# Patient Record
Sex: Female | Born: 1950 | Race: White | Hispanic: No | Smoking: Never smoker
Health system: Southern US, Community
[De-identification: ages and names within clinical notes are randomized; demographics above are authoritative.]

## PROBLEM LIST (undated history)

## (undated) DIAGNOSIS — F419 Anxiety disorder, unspecified: Secondary | ICD-10-CM

## (undated) DIAGNOSIS — M549 Dorsalgia, unspecified: Secondary | ICD-10-CM

## (undated) HISTORY — PX: CHOLECYSTECTOMY: SHX55

## (undated) HISTORY — PX: HERNIA REPAIR: SHX51

---

## 2011-09-11 ENCOUNTER — Emergency Department (HOSPITAL_BASED_OUTPATIENT_CLINIC_OR_DEPARTMENT_OTHER)
Admission: EM | Admit: 2011-09-11 | Discharge: 2011-09-12 | Disposition: A | Discharge status: Home / Self Care | Attending: Emergency Medicine | Admitting: Emergency Medicine

## 2011-09-11 ENCOUNTER — Emergency Department (HOSPITAL_BASED_OUTPATIENT_CLINIC_OR_DEPARTMENT_OTHER)

## 2011-09-11 ENCOUNTER — Encounter (HOSPITAL_BASED_OUTPATIENT_CLINIC_OR_DEPARTMENT_OTHER): Payer: Self-pay

## 2011-09-11 DIAGNOSIS — Z23 Encounter for immunization: Secondary | ICD-10-CM | POA: Insufficient documentation

## 2011-09-11 DIAGNOSIS — R071 Chest pain on breathing: Secondary | ICD-10-CM | POA: Insufficient documentation

## 2011-09-11 DIAGNOSIS — S5010XA Contusion of unspecified forearm, initial encounter: Secondary | ICD-10-CM

## 2011-09-11 DIAGNOSIS — W19XXXA Unspecified fall, initial encounter: Secondary | ICD-10-CM | POA: Insufficient documentation

## 2011-09-11 DIAGNOSIS — R0789 Other chest pain: Secondary | ICD-10-CM

## 2011-09-11 DIAGNOSIS — Y92009 Unspecified place in unspecified non-institutional (private) residence as the place of occurrence of the external cause: Secondary | ICD-10-CM | POA: Insufficient documentation

## 2011-09-11 MED ORDER — KETOROLAC TROMETHAMINE 30 MG/ML IJ SOLN
30.0000 mg | Freq: Once | INTRAMUSCULAR | Status: AC
  Administered 2011-09-11: 30 mg via INTRAVENOUS
  Filled 2011-09-11: qty 1

## 2011-09-11 MED ORDER — IOHEXOL 300 MG/ML  SOLN: 100.0000 mL | Freq: Once | INTRAMUSCULAR | Status: AC | PRN

## 2011-09-11 MED ORDER — TETANUS-DIPHTH-ACELL PERTUSSIS 5-2.5-18.5 LF-MCG/0.5 IM SUSP
0.5000 mL | Freq: Once | INTRAMUSCULAR | Status: AC
  Administered 2011-09-11: 0.5 mL via INTRAMUSCULAR
  Filled 2011-09-11: qty 0.5

## 2011-09-11 NOTE — ED Notes (Signed)
I cleaned abrasion on patient's left elbow with saline and hydrogen peroxide. I then applied bacitracin and wrapped kerlix over 2x2's for padding and comfort.

## 2011-09-11 NOTE — ED Provider Notes (Signed)
History   This chart was scribed for Martha Quarry, MD scribed by Martha King. The patient was seen in room MH09/MH09 seen at 21:48   CSN: [id number]  Arrival date & time 09/11/11  2114   First MD Initiated Contact with Patient 09/11/11 2148      Chief Complaint  Patient presents with  . Fall    (Consider location/radiation/quality/duration/timing/severity/associated sxs/prior treatment) HPI Martha King is a 61 y.o. female who presents to the Emergency Department for evaluation after a fall that occurred 7 hours ago today outside her condo. She says she is having sharp left rib pain with associated left hand and arm pain as a result of the fall. Pt states after the fall she remained on the ground briefly, but does explain she was ambulatory afterwards. She explains she tripped when her flip flop folded over causing her to fall and land on her left side. She denies head injury, or LOC.  Has hx of degenerative disk in back recently and attributes today's back pain to most recent back issues. She states she does not take any daily medications or have any other health problems.  PCP: Dr. Ninetta Lights in [place]   History reviewed. No pertinent past medical history.  History reviewed. No pertinent past surgical history.  No family history on file.  History  Substance Use Topics  . Smoking status: Never Smoker   . Smokeless tobacco: Not on file  . Alcohol Use: No   Review of Systems  All other systems reviewed and are negative.   10 Systems reviewed and are negative for acute change except as noted in the HPI. Allergies  Review of patient's allergies indicates not on file.  Home Medications  No current outpatient prescriptions on file.  BP 145/71  Pulse 98  Temp 98.3 F (36.8 C) (Oral)  Resp 18  SpO2 99%  Physical Exam  Nursing note and vitals reviewed. Constitutional: She is oriented to person, place, and time. She appears well-developed and well-nourished. No  distress.  HENT:  Head: Normocephalic and atraumatic.  Eyes: EOM are normal. Pupils are equal, round, and reactive to light.  Neck: Neck supple. No tracheal deviation present.  Cardiovascular: Normal rate.   Pulmonary/Chest: Effort normal. No respiratory distress.  Abdominal: Soft. She exhibits no distension.  Musculoskeletal: Normal range of motion. She exhibits tenderness. She exhibits no edema.       Diffusely tender to the ulnar aspect of the left forearm  Neurological: She is alert and oriented to person, place, and time. No sensory deficit.  Skin: Skin is warm and dry.       Has contusions to left forearm.  Psychiatric: She has a normal mood and affect. Her behavior is normal.    ED Course  Procedures (including critical care time) DIAGNOSTIC STUDIES: Oxygen Saturation is 99% on room air, normal by my interpretation.    COORDINATION OF CARE:  Labs Reviewed - No data to display No results found.   No diagnosis found.    MDM  CT reviewed by me and with patient.  Patient with decreased pain in elbow without point tenderness and full arom although contusion.  Patient states she is unable to take narcotics but will take tylenol and ibuprofen.  Wounds to be cleansed and tdap given.     Martha Quarry, MD 09/11/11 2322

## 2011-09-11 NOTE — ED Notes (Signed)
Patient fell over toe of flip flop falling onto concrete. Complains of left arm pain, wrist and left rib pain, no loc. Ambulatory. Swelling noted to arm

## 2012-12-19 IMAGING — CT CT ABD-PELV W/ CM
4 of 5 series · 12 of 46 positions shown, 17 images · IV contrast (APPLIED)
Comparison: None.

CT CHEST

CLINICAL DATA: Trauma protocol.  The patient fell on the left side
and has left rib pain.

CT CHEST, ABDOMEN AND PELVIS WITH CONTRAST
TECHNIQUE: Multidetector CT imaging of the chest, abdomen and
pelvis was performed following the standard protocol during bolus
administration of intravenous contrast.
Contrast:  100 ml Omnipaque 300

[Series 2: chest/abd/pel 5.0 b31f · axial · 0.98mm/px · z∈[+834,+1184]mm · 6 of 126 slices shown]
[im 14/126  soft-tissue]
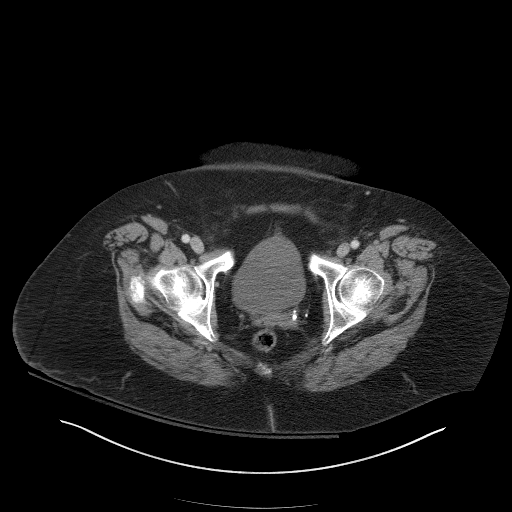
[im 28/126  soft-tissue]
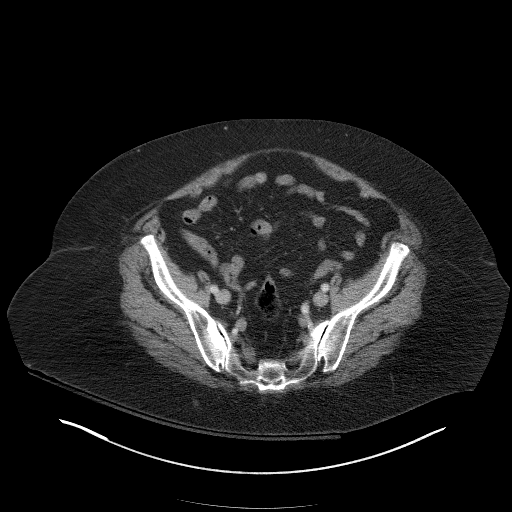
[im 42/126  soft-tissue]
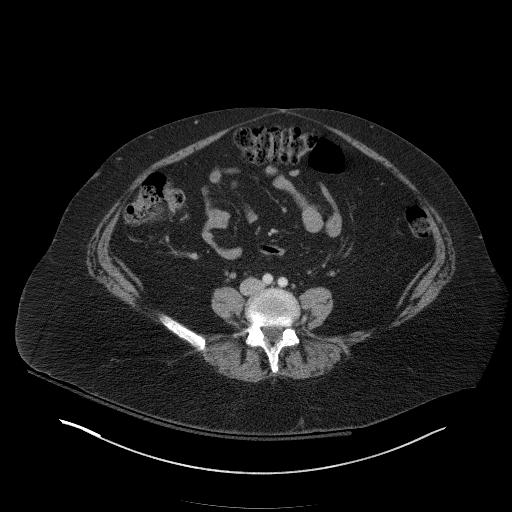
[im 56/126  soft-tissue]
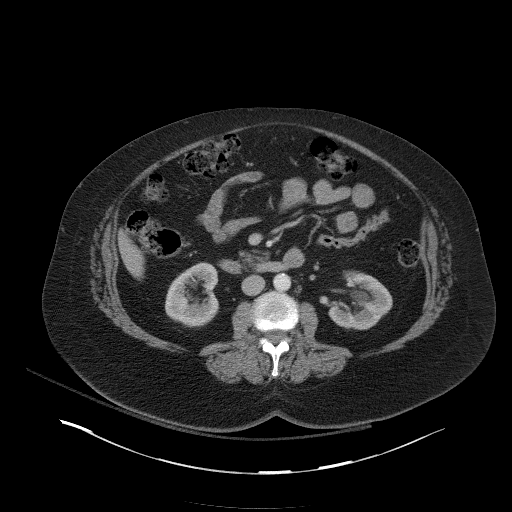
[im 70/126  soft-tissue]
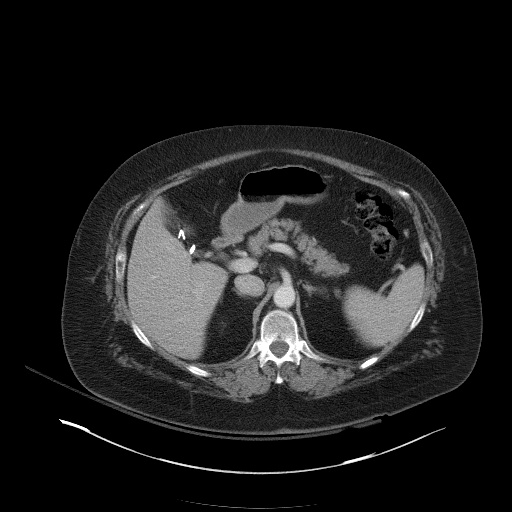
[im 84/126  soft-tissue]
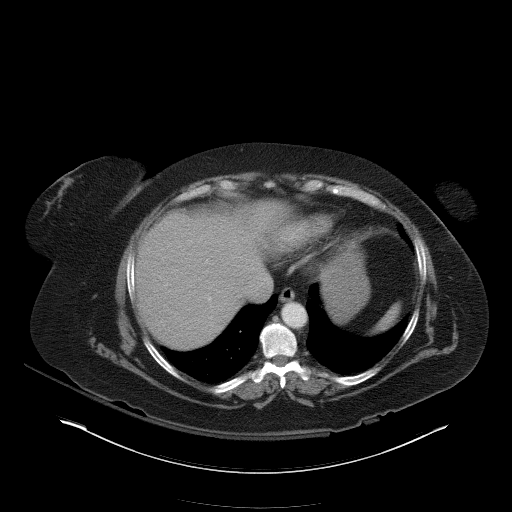

[Series 5: chest/abd/pel 3.0 coronal · coronal · 1.19mm/px · 3 of 102 slices shown, 4 images]
[im 34/102  soft-tissue]
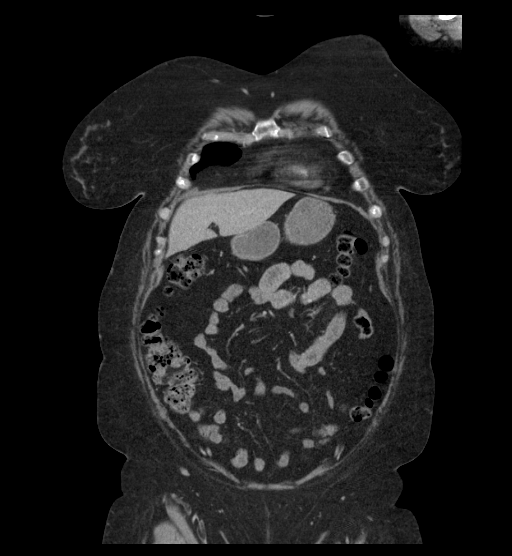
[im 45/102  soft-tissue]
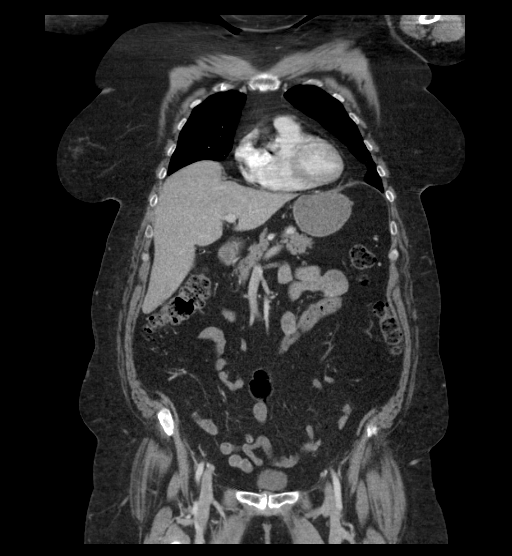
[im 45/102  bone]
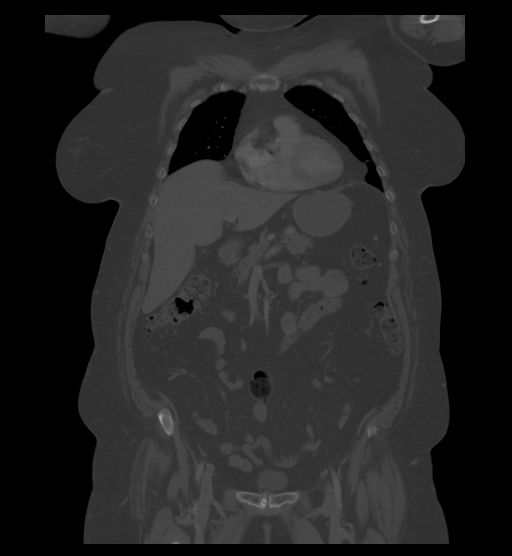
[im 57/102  soft-tissue]
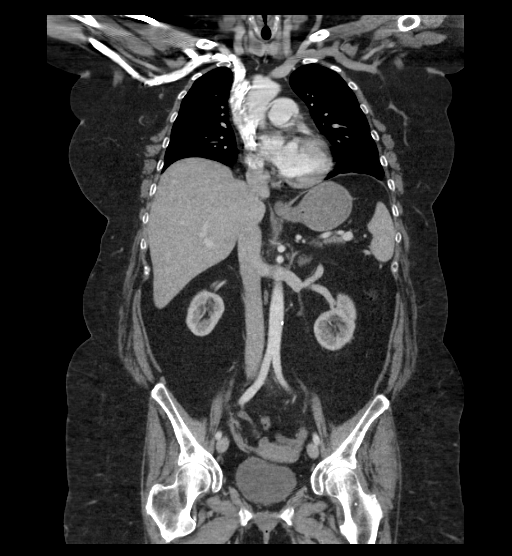

[Series 6: chest/abd/pel 3.0 sagittal · sagittal · 0.80mm/px · 1 of 138 slices shown, 2 images]
[im 46/138  soft-tissue]
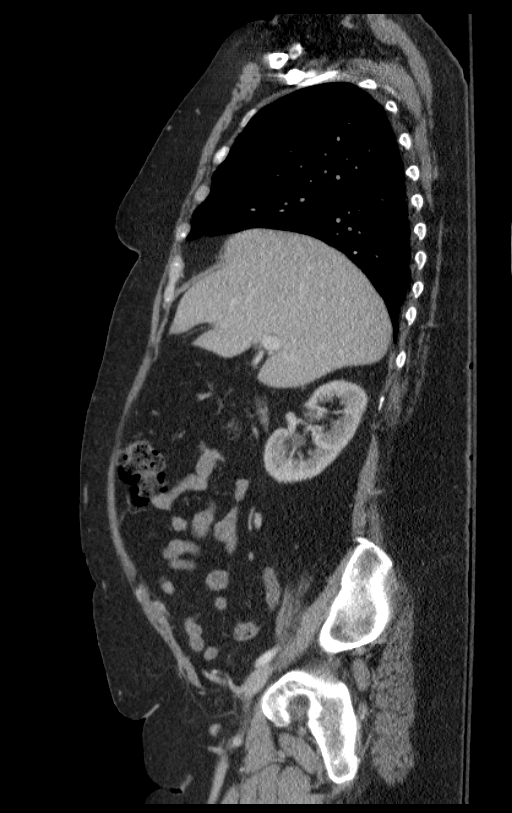
[im 46/138  bone]
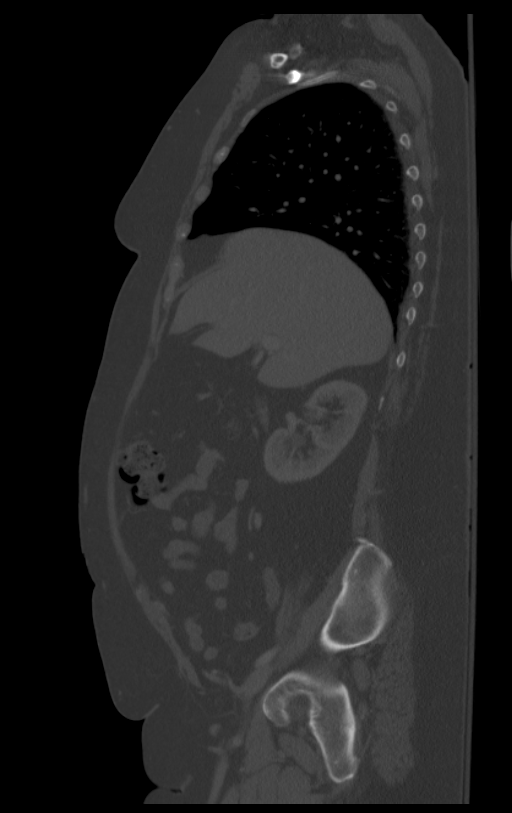

[Series 7: renal delay 5.0 b30f · axial · delayed · 0.97mm/px · z∈[+1032,+1077]mm · 2 of 27 slices shown, 5 images]
[im 9/27  soft-tissue]
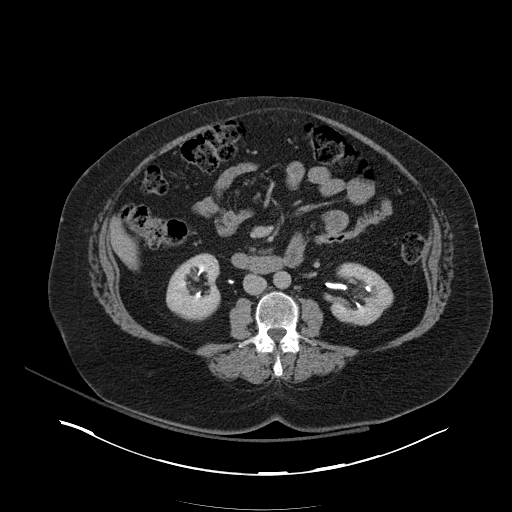
[im 9/27  lung]
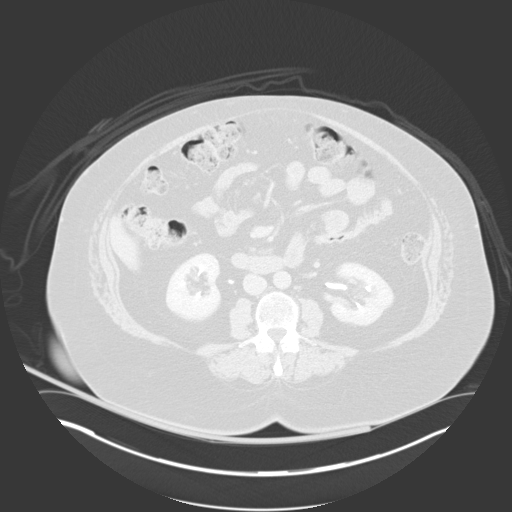
[im 9/27  bone]
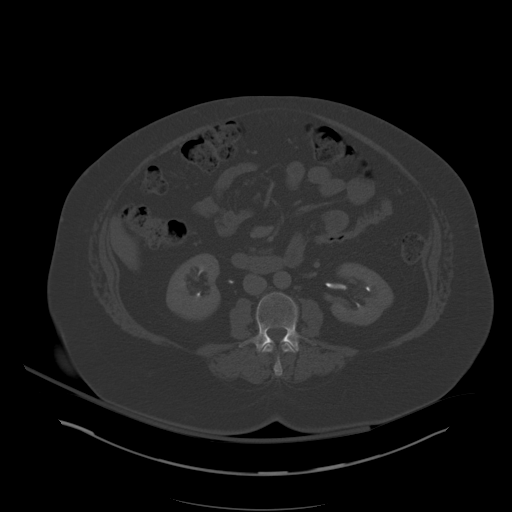
[im 18/27  soft-tissue]
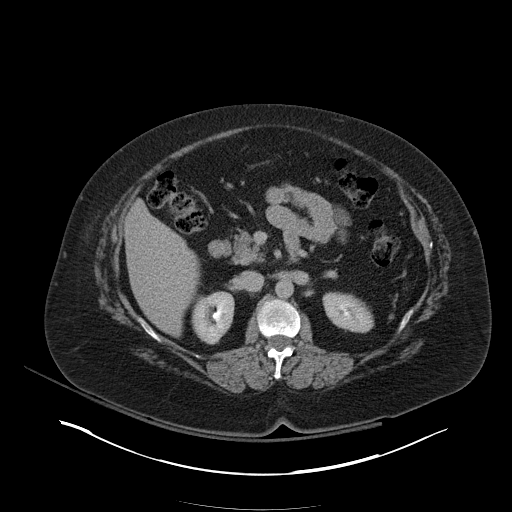
[im 18/27  lung]
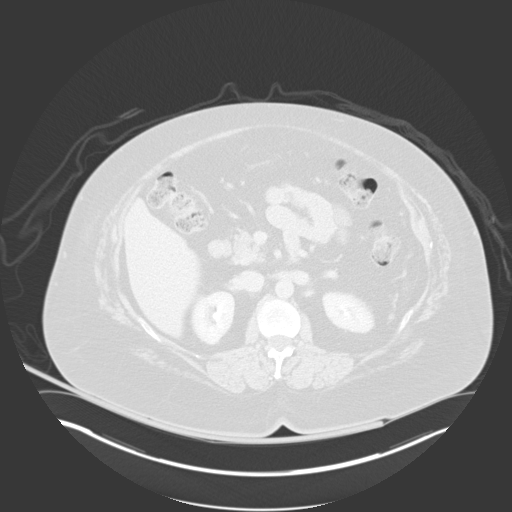

[12 of 46 positions shown; findings below may reference images not displayed]

FINDINGS: 11 mm hypodense nodule in the right thyroid gland.
Normal heart size.  Normal caliber thoracic aorta without evidence
of aneurysm or dissection.  No significant lymphadenopathy in the
chest.  No abnormal mediastinal fluid collections.  The esophagus
is decompressed.  No pleural effusion.  Dependent atelectasis in
the lung bases.  No focal airspace consolidation.  No pneumothorax.
Airways appear patent.

Normal alignment of the thoracic vertebra without compression
deformity.  Mild degenerative changes.  The sternum appears intact.
No displaced rib fractures identified.
IMPRESSION: No acute post-traumatic changes demonstrated in the chest.  Right
thyroid nodule.

CT ABDOMEN AND PELVIS
FINDINGS: Surgical absence of the gallbladder.  The liver, spleen,
pancreas, adrenal glands, abdominal aorta, and retroperitoneal
lymph nodes are unremarkable.  Small parapelvic cysts in the
kidneys.  No solid mass or hydronephrosis.  Homogeneous parenchymal
appearance.  The stomach, small bowel, and colon are not abnormally
distended.  No abnormal retroperitoneal fluid collections.  No free
fluid or free air in the abdomen.  Prominent visceral adipose
tissues.

Pelvis:  The uterus and adnexal structures are not enlarged.
Bladder wall is not thickened.  No free or loculated pelvic fluid
collections.  The appendix is normal.  No diverticulitis.  No
significant pelvic lymphadenopathy.

Normal alignment of the lumbar vertebrae without compression
deformity.  Mild degenerative disc disease at the lumbosacral
interspace.  The visualized pelvis, sacrum, and hips appear intact.
IMPRESSION: No acute process demonstrated in the abdomen or pelvis.  No
evidence of solid organ injury or bowel perforation.

## 2015-05-14 ENCOUNTER — Emergency Department (INDEPENDENT_AMBULATORY_CARE_PROVIDER_SITE_OTHER)
Admission: EM | Admit: 2015-05-14 | Discharge: 2015-05-14 | Disposition: A | Discharge status: Home / Self Care | Payer: Medicare Other | Source: Home / Self Care | Attending: Family Medicine | Admitting: Family Medicine

## 2015-05-14 ENCOUNTER — Encounter: Payer: Self-pay | Admitting: Emergency Medicine

## 2015-05-14 DIAGNOSIS — R0789 Other chest pain: Secondary | ICD-10-CM

## 2015-05-14 DIAGNOSIS — R51 Headache: Secondary | ICD-10-CM | POA: Diagnosis not present

## 2015-05-14 DIAGNOSIS — R519 Headache, unspecified: Secondary | ICD-10-CM

## 2015-05-14 NOTE — ED Notes (Signed)
Worst headache I've ever had" started today about 11:00am 10/10, Chest pains, tightness started Monday night 3-4

## 2015-05-14 NOTE — ED Provider Notes (Addendum)
CSN: [id number]     Arrival date & time 05/14/15  1527 History   First MD Initiated Contact with Patient 05/14/15 1546     Chief Complaint  Patient presents with  . Headache      HPI Comments: Patient complains of onset of substernal chest pain two days ago that lasted one hour and subsided.  At 10:30am today she developed a severe sharp right side headache.  She has no history of migraine headaches and states that this is the worst headache she has ever had.  She has had nausea without vomiting.  No localizing neurologic symptoms.  Patient is a 65 y.o. female presenting with headaches. The history is provided by the patient and a relative.  Headache Pain location:  R temporal and R parietal Quality:  Sharp Radiates to:  Does not radiate Severity currently:  10/10 Severity at highest:  10/10 Onset quality:  Sudden Timing:  Constant Progression:  Unchanged Chronicity:  New Similar to prior headaches: no   Context: bright light   Relieved by:  None tried Worsened by:  Nothing Ineffective treatments:  None tried Associated symptoms: blurred vision, fatigue, nausea, photophobia and visual change   Associated symptoms: no abdominal pain, no congestion, no dizziness, no eye pain, no facial pain, no fever, no focal weakness, no hearing loss, no loss of balance, no myalgias, no near-syncope, no neck pain, no neck stiffness, no numbness, no paresthesias, no seizures, no sinus pressure, no sore throat, no swollen glands, no syncope, no tingling, no URI, no vomiting and no weakness     History reviewed. No pertinent past medical history. History reviewed. No pertinent past surgical history. Family History  Problem Relation Age of Onset  . Anuerysm Mother   . Hypertension Father    Social History  Substance Use Topics  . Smoking status: Never Smoker   . Smokeless tobacco: None  . Alcohol Use: No   OB History    No data available     Review of Systems  Constitutional: Positive for  fatigue. Negative for fever.  HENT: Negative for congestion, hearing loss, sinus pressure and sore throat.   Eyes: Positive for blurred vision and photophobia. Negative for pain.  Cardiovascular: Negative for syncope and near-syncope.  Gastrointestinal: Positive for nausea. Negative for vomiting and abdominal pain.  Musculoskeletal: Negative for myalgias, neck pain and neck stiffness.  Neurological: Positive for headaches. Negative for dizziness, focal weakness, seizures, weakness, numbness, paresthesias and loss of balance.  All other systems reviewed and are negative.   Allergies  Review of patient's allergies indicates no known allergies.  Home Medications   Prior to Admission medications   Medication Sig Start Date End Date Taking? Authorizing Provider  escitalopram (LEXAPRO) 10 MG tablet Take 10 mg by mouth daily.   Yes Historical Provider, MD  acetaminophen (TYLENOL) 500 MG tablet Take 1,000 mg by mouth every 6 (six) hours as needed. For headache and pain.    Historical Provider, MD  predniSONE (DELTASONE) 10 MG tablet Take 10 mg by mouth daily.    Historical Provider, MD   Meds Ordered and Administered this Visit  Medications - No data to display  BP 136/84 mmHg  Pulse 76  Temp(Src) 97.8 F (36.6 C) (Oral)  Ht 5\' 3"  (1.6 m)  Wt 217 lb (98.431 kg)  BMI 38.45 kg/m2  SpO2 100% No data found.   Physical Exam Nursing notes and Vital Signs reviewed. Appearance:  Patient appears stated age, and in no acute distress.  She appears uncomfortable supine in darkened room, but is alert and oriented.  Patient is obese (BMI 38.5) Eyes:  Pupils are equal, round, and reactive to light and accomodation.  Extraocular movement is intact.  Conjunctivae are not inflamed.  Photophobia present  Nose:  Normal Mouth/Pharynx:   Tongue midline Neck:  Supple.  No adenopathy.  Carotids have normal upstrokes. Lungs:  Clear to auscultation.  Breath sounds are equal.  Moving air well. Heart:   Regular rate and rhythm without murmurs, rubs, or gallops.  Abdomen:  Nontender without masses or hepatosplenomegaly.  Bowel sounds are present.  No CVA or flank tenderness.  Extremities:  No edema.  Skin:  No rash present.   ED Course  Procedures none    Labs Reviewed -   EKG: Rate:  90 BPM PR:  122 msec QT:  356 msec QTcH:  408 msec QRSD:  84 msec QRS axis:  16 degrees Interpretation:   Sinus rhythm; Low voltage, otherwise no acute changes    MDM   1. Right-sided headache   2. Other chest pain    Severe acute unilateral headache in 65 year-old female patient with no previous headache history.  Recommend proceeding to [facility] Emergency Department for CT scan head and further evaluation as needed.  Patient's vital signs are stable and she is safe to travel by private vehicle.    Lattie Haw, MD 05/14/15 1816  Lattie Haw, MD 05/14/15 [phone]

## 2015-05-15 ENCOUNTER — Telehealth: Payer: Self-pay | Admitting: Emergency Medicine

## 2016-07-28 ENCOUNTER — Encounter: Payer: Self-pay | Admitting: *Deleted

## 2016-07-28 ENCOUNTER — Emergency Department (INDEPENDENT_AMBULATORY_CARE_PROVIDER_SITE_OTHER)
Admission: EM | Admit: 2016-07-28 | Discharge: 2016-07-28 | Disposition: A | Discharge status: Home / Self Care | Payer: Medicare Other | Source: Home / Self Care | Attending: Family Medicine | Admitting: Family Medicine

## 2016-07-28 DIAGNOSIS — M62830 Muscle spasm of back: Secondary | ICD-10-CM

## 2016-07-28 DIAGNOSIS — R1031 Right lower quadrant pain: Secondary | ICD-10-CM

## 2016-07-28 DIAGNOSIS — R109 Unspecified abdominal pain: Secondary | ICD-10-CM

## 2016-07-28 DIAGNOSIS — M549 Dorsalgia, unspecified: Secondary | ICD-10-CM | POA: Diagnosis not present

## 2016-07-28 HISTORY — DX: Anxiety disorder, unspecified: F41.9

## 2016-07-28 HISTORY — DX: Dorsalgia, unspecified: M54.9

## 2016-07-28 LAB — POCT URINALYSIS DIP (MANUAL ENTRY)
BILIRUBIN UA: NEGATIVE mg/dL
Bilirubin, UA: NEGATIVE
Glucose, UA: NEGATIVE mg/dL
Nitrite, UA: NEGATIVE
PROTEIN UA: NEGATIVE mg/dL
RBC UA: NEGATIVE
Spec Grav, UA: 1.015 (ref 1.010–1.025)
Urobilinogen, UA: 1 E.U./dL
pH, UA: 6 (ref 5.0–8.0)

## 2016-07-28 MED ORDER — CYCLOBENZAPRINE HCL 5 MG PO TABS: 5.0000 mg | ORAL_TABLET | Freq: Two times a day (BID) | ORAL | 0 refills | Status: AC | PRN

## 2016-07-28 NOTE — Discharge Instructions (Signed)
°  Flexeril (cyclobenzaprine) is a muscle relaxer and may cause drowsiness. Do not drink alcohol, drive, or operate heavy machinery while taking. ° °

## 2016-07-28 NOTE — ED Provider Notes (Signed)
CSN: [id number]     Arrival date & time 07/28/16  1840 History   First MD Initiated Contact with Patient 07/28/16 1859     Chief Complaint  Patient presents with  . Flank Pain   (Consider location/radiation/quality/duration/timing/severity/associated sxs/prior Treatment) HPI Martha King is a 66 y.o. female presenting to UC with c/o Right flank pain that started suddenly around 12:15PM today while she was walking in the kitchen. Pain was sharp and cramping, severe initially, gradually improved with walking, Tylenol and a heating pain but pain is still present and has radiated into Right flank and Right groin.  She feels like she has urinated somewhat more frequently this afternoon but denies burning with urination or hematuria. No prior hx of kidney stones that she knows of.  Denies fever, chills, n/v/d. Hx of multiple abdominal surgeries including removal of her gallbladder, appendix, and hernia repairs.      Past Medical History:  Diagnosis Date  . Anxiety   . Back pain    Past Surgical History:  Procedure Laterality Date  . CESAREAN SECTION    . CHOLECYSTECTOMY    . HERNIA REPAIR     Family History  Problem Relation Age of Onset  . Anuerysm Mother   . Hypertension Father    Social History  Substance Use Topics  . Smoking status: Never Smoker  . Smokeless tobacco: Never Used  . Alcohol use No   OB History    No data available     Review of Systems  Gastrointestinal: Negative for abdominal pain, diarrhea, nausea and vomiting.  Genitourinary: Positive for flank pain (Right ) and frequency. Negative for decreased urine volume, dysuria, hematuria, pelvic pain and urgency.  Musculoskeletal: Positive for back pain and myalgias.    Allergies  Codeine and Ibuprofen  Home Medications   Prior to Admission medications   Medication Sig Start Date End Date Taking? Authorizing Provider  escitalopram (LEXAPRO) 10 MG tablet Take 10 mg by mouth daily.   Yes [provider]  acetaminophen (TYLENOL) 500 MG tablet Take 1,000 mg by mouth every 6 (six) hours as needed. For headache and pain.    [provider]  cyclobenzaprine (FLEXERIL) 5 MG tablet Take 1-2 tablets (5-10 mg total) by mouth 2 (two) times daily as needed for muscle spasms. 07/28/16   Lurene Shadow, PA-C   Meds Ordered and Administered this Visit  Medications - No data to display  BP (!) 160/75 (BP Location: Left Arm)   Pulse 64   Temp 97.8 F (36.6 C) (Oral)   Resp 18   Ht 5\' 3"  (1.6 m)   Wt 216 lb (98 kg)   SpO2 100%   BMI 38.26 kg/m  No data found.   Physical Exam  Constitutional: She is oriented to person, place, and time. She appears well-developed and well-nourished. No distress.  HENT:  Head: Normocephalic and atraumatic.  Mouth/Throat: Oropharynx is clear and moist.  Eyes: EOM are normal.  Neck: Normal range of motion.  Cardiovascular: Normal rate and regular rhythm.   Pulmonary/Chest: Effort normal and breath sounds normal. No respiratory distress. She has no wheezes. She has no rales.  Abdominal: Soft. She exhibits no distension and no mass. There is tenderness ( diffuse). There is no rebound and no guarding.  Musculoskeletal: Normal range of motion. She exhibits tenderness. She exhibits no edema.  No midline spinal tenderness. Tenderness to Right mid back muscles. Full ROM upper and lower extremities with increased pain when lying down or  changing positions. Negative straight leg raise.  Neurological: She is alert and oriented to person, place, and time.  Skin: Skin is warm and dry. No rash noted. She is not diaphoretic. No erythema.  Psychiatric: She has a normal mood and affect. Her behavior is normal.  Nursing note and vitals reviewed.   Urgent Care Course     Procedures (including critical care time)  Labs Review Labs Reviewed  POCT URINALYSIS DIP (MANUAL ENTRY) - Abnormal; Notable for the following:       Result Value   Leukocytes, UA Trace (*)     All other components within normal limits  URINE CULTURE    Imaging Review No results found.   MDM   1. Mid back pain on right side   2. Back muscle spasm   3. Right flank pain   4. Right groin pain    Pt c/o Right mid back and flank pain  UA: trace leukocytes Will send culture  Exam most c/w a muscle spasm. Rx: Flexeril Pt cannot take NSAIDs due to stomach upset.  Alternate cool and warm compresses. She has a previously scheduled appointment with PCP on Monday. Encouraged to f/u as planned unless worsening of symptoms this evening or this weekend.     Lurene Shadowhelps, Sylvester Salonga O, [place]PA-C 07/28/16 1955

## 2016-07-28 NOTE — ED Triage Notes (Signed)
Pt c/o RT flank pain with sudden onset at 1215pm today. The pain radiates to her abd now and to her RT groin area. Denies fever or dysuria. She took tylenol today.

## 2016-07-29 LAB — URINE CULTURE: Organism ID, Bacteria: NO GROWTH

## 2016-07-30 ENCOUNTER — Telehealth: Payer: Self-pay | Admitting: Emergency Medicine

## 2016-07-30 NOTE — Telephone Encounter (Signed)
Patient called back, advised she is feeling a little better. Advises to call with questions or concerns

## 2019-09-15 ENCOUNTER — Other Ambulatory Visit: Payer: Self-pay

## 2019-09-15 ENCOUNTER — Emergency Department (HOSPITAL_BASED_OUTPATIENT_CLINIC_OR_DEPARTMENT_OTHER)
Admission: EM | Admit: 2019-09-15 | Discharge: 2019-09-15 | Disposition: A | Discharge status: Home / Self Care | Payer: Medicare Other | Attending: Emergency Medicine | Admitting: Emergency Medicine

## 2019-09-15 ENCOUNTER — Emergency Department (HOSPITAL_BASED_OUTPATIENT_CLINIC_OR_DEPARTMENT_OTHER): Payer: Medicare Other

## 2019-09-15 ENCOUNTER — Encounter (HOSPITAL_BASED_OUTPATIENT_CLINIC_OR_DEPARTMENT_OTHER): Payer: Self-pay | Admitting: *Deleted

## 2019-09-15 DIAGNOSIS — R300 Dysuria: Secondary | ICD-10-CM | POA: Diagnosis not present

## 2019-09-15 DIAGNOSIS — R5383 Other fatigue: Secondary | ICD-10-CM | POA: Diagnosis not present

## 2019-09-15 DIAGNOSIS — M791 Myalgia, unspecified site: Secondary | ICD-10-CM | POA: Insufficient documentation

## 2019-09-15 DIAGNOSIS — R197 Diarrhea, unspecified: Secondary | ICD-10-CM | POA: Diagnosis not present

## 2019-09-15 DIAGNOSIS — U071 COVID-19: Secondary | ICD-10-CM | POA: Diagnosis not present

## 2019-09-15 DIAGNOSIS — R05 Cough: Secondary | ICD-10-CM | POA: Insufficient documentation

## 2019-09-15 DIAGNOSIS — R509 Fever, unspecified: Secondary | ICD-10-CM | POA: Insufficient documentation

## 2019-09-15 DIAGNOSIS — R0789 Other chest pain: Secondary | ICD-10-CM | POA: Insufficient documentation

## 2019-09-15 DIAGNOSIS — M542 Cervicalgia: Secondary | ICD-10-CM | POA: Diagnosis not present

## 2019-09-15 DIAGNOSIS — R109 Unspecified abdominal pain: Secondary | ICD-10-CM | POA: Diagnosis present

## 2019-09-15 DIAGNOSIS — R112 Nausea with vomiting, unspecified: Secondary | ICD-10-CM | POA: Diagnosis not present

## 2019-09-15 LAB — CBC WITH DIFFERENTIAL/PLATELET
Abs Immature Granulocytes: 0.01 10*3/uL (ref 0.00–0.07)
Basophils Absolute: 0 10*3/uL (ref 0.0–0.1)
Basophils Relative: 0 %
Eosinophils Absolute: 0 10*3/uL (ref 0.0–0.5)
Eosinophils Relative: 0 %
HCT: 39.1 % (ref 36.0–46.0)
Hemoglobin: 13.1 g/dL (ref 12.0–15.0)
Immature Granulocytes: 0 %
Lymphocytes Relative: 38 %
Lymphs Abs: 1.2 10*3/uL (ref 0.7–4.0)
MCH: 30.1 pg (ref 26.0–34.0)
MCHC: 33.5 g/dL (ref 30.0–36.0)
MCV: 89.9 fL (ref 80.0–100.0)
Monocytes Absolute: 0.3 10*3/uL (ref 0.1–1.0)
Monocytes Relative: 9 %
Neutro Abs: 1.7 10*3/uL (ref 1.7–7.7)
Neutrophils Relative %: 53 %
Platelets: 171 10*3/uL (ref 150–400)
RBC: 4.35 MIL/uL (ref 3.87–5.11)
RDW: 13.2 % (ref 11.5–15.5)
WBC: 3.2 10*3/uL — ABNORMAL LOW (ref 4.0–10.5)
nRBC: 0 % (ref 0.0–0.2)

## 2019-09-15 LAB — URINALYSIS, ROUTINE W REFLEX MICROSCOPIC
Bilirubin Urine: NEGATIVE
Glucose, UA: NEGATIVE mg/dL
Hgb urine dipstick: NEGATIVE
Ketones, ur: NEGATIVE mg/dL
Nitrite: NEGATIVE
Protein, ur: NEGATIVE mg/dL
Specific Gravity, Urine: 1.015 (ref 1.005–1.030)
pH: 6.5 (ref 5.0–8.0)

## 2019-09-15 LAB — COMPREHENSIVE METABOLIC PANEL
ALT: 73 U/L — ABNORMAL HIGH (ref 0–44)
AST: 69 U/L — ABNORMAL HIGH (ref 15–41)
Albumin: 3.3 g/dL — ABNORMAL LOW (ref 3.5–5.0)
Alkaline Phosphatase: 68 U/L (ref 38–126)
Anion gap: 11 (ref 5–15)
BUN: 8 mg/dL (ref 8–23)
CO2: 25 mmol/L (ref 22–32)
Calcium: 8.2 mg/dL — ABNORMAL LOW (ref 8.9–10.3)
Chloride: 103 mmol/L (ref 98–111)
Creatinine, Ser: 0.67 mg/dL (ref 0.44–1.00)
GFR calc Af Amer: >60 mL/min (ref >60)
GFR calc non Af Amer: >60 mL/min (ref >60)
Glucose, Bld: 111 mg/dL — ABNORMAL HIGH (ref 70–99)
Potassium: 3.1 mmol/L — ABNORMAL LOW (ref 3.5–5.1)
Sodium: 139 mmol/L (ref 135–145)
Total Bilirubin: 0.5 mg/dL (ref 0.3–1.2)
Total Protein: 6.1 g/dL — ABNORMAL LOW (ref 6.5–8.1)

## 2019-09-15 LAB — TROPONIN I (HIGH SENSITIVITY): Troponin I (High Sensitivity): 4 ng/L (ref <18)

## 2019-09-15 LAB — URINALYSIS, MICROSCOPIC (REFLEX)

## 2019-09-15 LAB — LIPASE, BLOOD: Lipase: 25 U/L (ref 11–51)

## 2019-09-15 LAB — LACTIC ACID, PLASMA: Lactic Acid, Venous: 0.9 mmol/L (ref 0.5–1.9)

## 2019-09-15 MED ORDER — PROMETHAZINE HCL 25 MG/ML IJ SOLN
12.5000 mg | Freq: Once | INTRAMUSCULAR | Status: AC
Start: 2019-09-15 — End: 2019-09-15
  Administered 2019-09-15: 12.5 mg via INTRAVENOUS
  Filled 2019-09-15: qty 1

## 2019-09-15 MED ORDER — SODIUM CHLORIDE 0.9 % IV BOLUS
1000.0000 mL | Freq: Once | INTRAVENOUS | Status: AC
  Administered 2019-09-15: 1000 mL via INTRAVENOUS

## 2019-09-15 MED ORDER — CEPHALEXIN 500 MG PO CAPS: 500.0000 mg | ORAL_CAPSULE | Freq: Two times a day (BID) | ORAL | 0 refills | Status: AC

## 2019-09-15 MED ORDER — POTASSIUM CHLORIDE CRYS ER 20 MEQ PO TBCR
40.0000 meq | EXTENDED_RELEASE_TABLET | Freq: Once | ORAL | Status: AC
  Administered 2019-09-15: 40 meq via ORAL
  Filled 2019-09-15: qty 2

## 2019-09-15 MED ORDER — PROMETHAZINE HCL 25 MG PO TABS
25.0000 mg | ORAL_TABLET | Freq: Once | ORAL | Status: AC
  Administered 2019-09-15: 25 mg via ORAL
  Filled 2019-09-15: qty 1

## 2019-09-15 MED ORDER — PROMETHAZINE HCL 25 MG PO TABS: 25.0000 mg | ORAL_TABLET | Freq: Four times a day (QID) | ORAL | 0 refills | Status: AC | PRN

## 2019-09-15 NOTE — ED Provider Notes (Signed)
[facility] EMERGENCY DEPARTMENT Provider Note   CSN: [id number] Arrival date & time: 09/15/19  1620     History Chief Complaint  Patient presents with  . Abdominal Pain    Martha King is a 69 y.o. female.  Patient with recently diagnosed COVID-19, on day 10 of illness, presents for feeling poorly. She initially had body aches and a fever which prompted testing. From a respiratory standpoint she has a cough, but no significant shortness of breath. Patient's main symptoms at this point involve severe nausea, diarrhea, generalized body aches. She has had decreased appetite due to nausea. Body aches involve chest and neck. She has a burning sensation in her chest. Surgical history of hernia repair, cholecystectomy, ovary removal. No urinary symptoms. Patient has been taking Zofran, however this makes her worse. Patient was seen at [facility] 2 days ago. She had a CT scan of her abdomen at that time which was negative for acute pathology other than patchy infiltrates consistent with Covid pneumonia. She was admitted to the hospital and remained in the emergency department overnight. Symptoms were controlled and she was discharged. Patient denies any recent vomiting. She had 2 episodes of nonbloody diarrhea this morning.        Past Medical History:  Diagnosis Date  . Anxiety   . Back pain     There are no problems to display for this patient.   Past Surgical History:  Procedure Laterality Date  . CESAREAN SECTION    . CHOLECYSTECTOMY    . HERNIA REPAIR       OB History   No obstetric history on file.     Family History  Problem Relation Age of Onset  . Anuerysm Mother   . Hypertension Father     Social History   Tobacco Use  . Smoking status: Never Smoker  . Smokeless tobacco: Never Used  Vaping Use  . Vaping Use: Never used  Substance Use Topics  . Alcohol use: No  . Drug use: No    Home Medications Prior to Admission medications     Medication Sig Start Date End Date Taking? Authorizing Provider  acetaminophen (TYLENOL) 500 MG tablet Take 1,000 mg by mouth every 6 (six) hours as needed. For headache and pain.    [provider]  cyclobenzaprine (FLEXERIL) 5 MG tablet Take 1-2 tablets (5-10 mg total) by mouth 2 (two) times daily as needed for muscle spasms. 07/28/16   Lurene ShadowPhelps, Erin O, PA-C  escitalopram (LEXAPRO) 10 MG tablet Take 10 mg by mouth daily.    [provider]    Allergies    Codeine and Ibuprofen  Review of Systems   Review of Systems  Constitutional: Positive for fatigue. Negative for chills and fever.  HENT: Negative for rhinorrhea and sore throat.   Eyes: Negative for redness.  Respiratory: Positive for cough and chest tightness. Negative for shortness of breath.   Cardiovascular: Negative for chest pain.  Gastrointestinal: Positive for diarrhea and nausea. Negative for abdominal pain, constipation and vomiting.  Genitourinary: Positive for dysuria. Negative for frequency, hematuria and urgency.  Musculoskeletal: Positive for myalgias and neck pain.  Skin: Negative for rash.  Neurological: Negative for headaches.    Physical Exam Updated Vital Signs BP 128/64 (BP Location: Right Arm)   Pulse 78   Temp 98.5 F (36.9 C) (Oral)   Resp (!) 24   Ht 5\' 3"  (1.6 m)   Wt 98.4 kg   SpO2 96%   BMI  38.44 kg/m   Physical Exam Vitals and nursing note reviewed.  Constitutional:      General: She is not in acute distress.    Appearance: She is well-developed.  HENT:     Head: Normocephalic and atraumatic.     Right Ear: External ear normal.     Left Ear: External ear normal.     Nose: Nose normal.  Eyes:     Conjunctiva/sclera: Conjunctivae normal.  Cardiovascular:     Rate and Rhythm: Normal rate and regular rhythm.     Heart sounds: No murmur heard.   Pulmonary:     Effort: No respiratory distress.     Breath sounds: No wheezing, rhonchi or rales.  Abdominal:      Palpations: Abdomen is soft.     Tenderness: There is generalized abdominal tenderness (mild). There is no guarding or rebound.  Musculoskeletal:     Cervical back: Normal range of motion and neck supple.     Right lower leg: No edema.     Left lower leg: No edema.  Skin:    General: Skin is warm and dry.     Findings: No rash.  Neurological:     General: No focal deficit present.     Mental Status: She is alert. Mental status is at baseline.     Motor: No weakness.  Psychiatric:        Mood and Affect: Mood normal.     ED Results / Procedures / Treatments   Labs (all labs ordered are listed, but only abnormal results are displayed) Labs Reviewed  CBC WITH DIFFERENTIAL/PLATELET - Abnormal; Notable for the following components:      Result Value   WBC 3.2 (*)    All other components within normal limits  COMPREHENSIVE METABOLIC PANEL - Abnormal; Notable for the following components:   Potassium 3.1 (*)    Glucose, Bld 111 (*)    Calcium 8.2 (*)    Total Protein 6.1 (*)    Albumin 3.3 (*)    AST 69 (*)    ALT 73 (*)    All other components within normal limits  URINALYSIS, ROUTINE W REFLEX MICROSCOPIC - Abnormal; Notable for the following components:   Leukocytes,Ua SMALL (*)    All other components within normal limits  URINALYSIS, MICROSCOPIC (REFLEX) - Abnormal; Notable for the following components:   Bacteria, UA FEW (*)    All other components within normal limits  LIPASE, BLOOD  LACTIC ACID, PLASMA  TROPONIN I (HIGH SENSITIVITY)    EKG EKG Interpretation  Date/Time:  Saturday September 15 2019 18:27:50 EDT Ventricular Rate:  78 PR Interval:    QRS Duration: 81 QT Interval:  404 QTC Calculation: 461 R Axis:   158 Text Interpretation: Right and left arm electrode reversal, interpretation assumes no reversal Sinus rhythm Probable lateral infarct, age indeterminate No previous ECGs available Confirmed by Richardean Canal ([id number]) on 09/15/2019 6:30:51  PM   Radiology DG Chest Port 1 View  Result Date: 09/15/2019 CLINICAL DATA:  Burning in the chest. Known Covid-19 positive. UPPER abdominal pain for 4 days. Nausea, vomiting, diarrhea. EXAM: PORTABLE CHEST 1 VIEW COMPARISON:  03/05/2019 FINDINGS: Heart size is enlarged, stable in appearance. There are faint reticular changes throughout the lung bases bilaterally, favoring infectious process over pulmonary edema. No discrete areas of consolidation. IMPRESSION: 1. Cardiomegaly. 2. Suspect infectious process. Electronically Signed   By: Norva Pavlov M.D.   On: 09/15/2019 18:20    Procedures Procedures (including  critical care time)  Medications Ordered in ED Medications  sodium chloride 0.9 % bolus 1,000 mL ( Intravenous Stopped 09/15/19 1925)  promethazine (PHENERGAN) injection 12.5 mg (12.5 mg Intravenous Given 09/15/19 1749)  potassium chloride SA (KLOR-CON) CR tablet 40 mEq (40 mEq Oral Given 09/15/19 1927)  sodium chloride 0.9 % bolus 1,000 mL ( Intravenous Stopped 09/15/19 2245)  promethazine (PHENERGAN) tablet 25 mg (25 mg Oral Given 09/15/19 2246)    ED Course  I have reviewed the triage vital signs and the nursing notes.  Pertinent labs & imaging results that were available during my care of the patient were reviewed by me and considered in my medical decision making (see chart for details).  Patient seen and examined. Will recheck lab work-up, chest x-ray. Given reported chest burning, will check EKG and troponin. Given persistent nausea, will check lactic acid.  Overall low suspicion for mesenteric ischemia.  Will treat with IV fluids, IV Phenergan (Zofran makes her feel worse). Offered pain medication, she declines at this time. Vital signs are reassuring. Overall she appears to be a feeling poorly, however exam is reassuring.  Vital signs reviewed and are as follows: BP 128/64 (BP Location: Right Arm)   Pulse 78   Temp 98.5 F (36.9 C) (Oral)   Resp (!) 24   Ht 5\' 3"  (1.6 m)    Wt 98.4 kg   SpO2 96%   BMI 38.44 kg/m   8:16 PM patient rechecked.  Updated on results.  She took potassium without vomiting.  She has only had a couple sips of water.  States that she has really noticed much difference with Phenergan.  She has not yet urinated.  Will give additional fluid bolus.  Will ambulate to make sure that her pulse ox is okay.  Will reassess.  10:47 PM patient continues to feel poorly but is doing well overall. No hypoxia. UA reviewed, shows some white blood cells. When asked about dysuria, she states that she sometimes has burning.  At this point, she has received 2 L of fluid, antiemetics and is not vomiting. Plan for discharge to home. Will give Phenergan for home and to help with sleep. Will give prescription for Keflex to cover for UTI.  Strongly encouraged PCP follow-up in the upcoming week. Discussed signs and symptoms to return including worsening shortness of breath or trouble breathing, severe abdominal pain, persistent vomiting. Encourage good hydration at home. We discussed brat diet at bedside.    MDM Rules/Calculators/A&P                          Patient on day 10 of her coronavirus infection. She has significant nausea. Vomiting has been under control however she has had decreased appetite. Patient was recently admitted overnight at [facility]. She had a CT at that visit which did not demonstrate any intra-abdominal pathology. Labs today are reassuring. She had a low potassium which was repleted. Chest x-ray with possible mild pneumonia. No hypoxia during ED stay. Nausea was treated with Phenergan. She has not had any additional vomiting. UTI with white blood cells and clumping, although I do not suspect that overall her symptoms are fully explained by urinary infection or pyelonephritis. Patient does not have any flank pain or fever. Cardiac etiology was entertained -- EKG and troponin reassuring. At this point, I do not feel that she  requires admission to the hospital. She needs to continue supportive treatment at home with close  PCP follow-up. She is not a candidate for monoclonal antibodies due to duration of illness.   Final Clinical Impression(s) / ED Diagnoses Final diagnoses:  Non-intractable vomiting with nausea, unspecified vomiting type  Dysuria  COVID-19    Rx / DC Orders ED Discharge Orders         Ordered    promethazine (PHENERGAN) 25 MG tablet  Every 6 hours PRN     Discontinue  Reprint     09/15/19 2245    cephALEXin (KEFLEX) 500 MG capsule  2 times daily     Discontinue  Reprint     09/15/19 2245           Renne Crigler, PA-C 09/15/19 2250    Charlynne Pander, MD 09/16/19 [phone]

## 2019-09-15 NOTE — ED Triage Notes (Signed)
Pt reports upper abdominal pain x 4 days. Reports having n/v/d also. She was dx with covid 9 days ago. She was seen at [facility] 2 days ago and had CT scan. States that they wanted to admit her but "had no rooms" so she stayed in ED overnight and was d/c home with zofran. States aching is worse and she is very nauseated

## 2019-09-15 NOTE — ED Notes (Signed)
SR x up, pt instructed not to get up without staff in room, callbell within reach, placed on cont POX monitoring with int NBP measurements.

## 2019-09-15 NOTE — Discharge Instructions (Signed)
Please read and follow all provided instructions.  Your diagnoses today include:  1. Non-intractable vomiting with nausea, unspecified vomiting type   2. Dysuria   3. COVID-19     Tests performed today include:  Blood counts and electrolytes -shows low potassium  Liver function tests -slightly elevated, likely due to coronavirus  Chest x-ray -possible mild pneumonia likely due to coronavirus  Urine test -we see some white blood cells which could indicate infection  Vital signs. See below for your results today.   Medications prescribed:   Phenergan (promethazine) - for nausea and vomiting   Keflex (cephalexin) - antibiotic  You have been prescribed an antibiotic medicine: take the entire course of medicine even if you are feeling better. Stopping early can cause the antibiotic not to work.  Take any prescribed medications only as directed.  Home care instructions:  Follow any educational materials contained in this packet.  BE VERY CAREFUL not to take multiple medicines containing Tylenol (also called acetaminophen). Doing so can lead to an overdose which can damage your liver and cause liver failure and possibly death.   Follow-up instructions: Please follow-up with your primary care provider in the next 3 days for further evaluation of your symptoms.   Return instructions:   Please return to the Emergency Department if you experience worsening symptoms.   Return if you have persistent vomiting, high fever, severe abdominal pain, worsening shortness of breath or trouble breathing  Please return if you have any other emergent concerns.  Additional Information:  Your vital signs today were: BP (!) 157/73 (BP Location: Right Arm)    Pulse 95    Temp 98.5 F (36.9 C) (Oral)    Resp 14    Ht 5\' 3"  (1.6 m)    Wt 98.4 kg    SpO2 95%    BMI 38.44 kg/m  If your blood pressure (BP) was elevated above 135/85 this visit, please have this repeated by your doctor within one  month. --------------

## 2019-09-15 NOTE — ED Notes (Signed)
AVS reviewed with client and daughter, discussed when the need may arise for her to return to the ED, also discussed the two Rx by the EDP as well. Encouraged client to practice strict handwashing, isolating herself from family and to take Rx as prescribed. Opportunity for questions provided.

## 2019-09-15 NOTE — ED Notes (Signed)
ED Provider at bedside. 

## 2019-09-15 NOTE — ED Notes (Signed)
Per EDP order, pt given fluids and/or food for PO challenge. Pt verbalized understanding to utilize call bell if nausea or emesis occur. 

## 2020-12-23 IMAGING — DX DG CHEST 1V PORT
1 series · 1 of 1 positions shown · non-contrast
Comparison: 03/05/2019

CLINICAL DATA: Burning in the chest. Known Sovid-V7 positive. UPPER
abdominal pain for 4 days. Nausea, vomiting, diarrhea.

EXAM:
PORTABLE CHEST 1 VIEW

[chest ap]
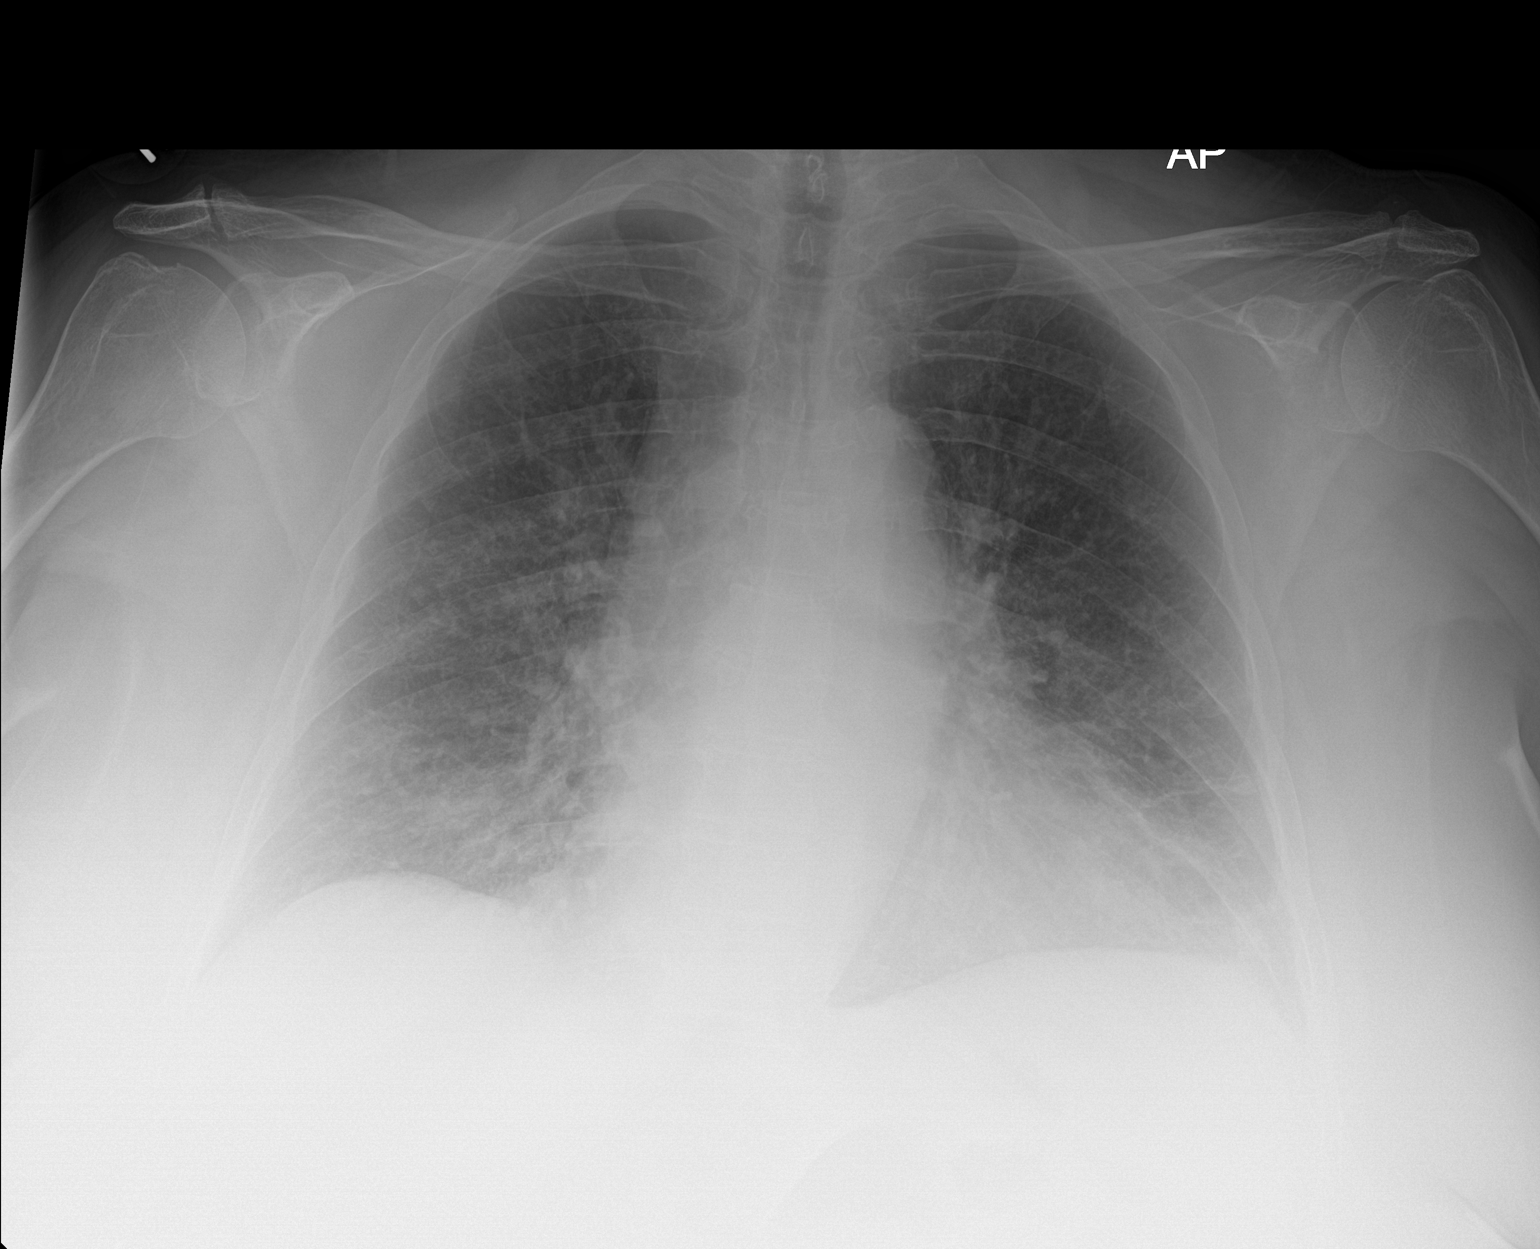

[1 of 1 positions shown; findings below may reference images not displayed]

FINDINGS: Heart size is enlarged, stable in appearance. There are faint
reticular changes throughout the lung bases bilaterally, favoring
infectious process over pulmonary edema. No discrete areas of
consolidation.
IMPRESSION: 1. Cardiomegaly.
2. Suspect infectious process.
